# Patient Record
Sex: Female | Born: 1995 | Race: White | Hispanic: No | Marital: Single | Smoking: Current every day smoker
Health system: Southern US, Community
[De-identification: ages and names within clinical notes are randomized; demographics above are authoritative.]

## PROBLEM LIST (undated history)

## (undated) DIAGNOSIS — F1910 Other psychoactive substance abuse, uncomplicated: Secondary | ICD-10-CM

## (undated) DIAGNOSIS — R011 Cardiac murmur, unspecified: Secondary | ICD-10-CM

## (undated) HISTORY — PX: ADENOIDECTOMY: SUR15

## (undated) HISTORY — PX: OTHER SURGICAL HISTORY: SHX169

---
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
## 2017-04-15 ENCOUNTER — Emergency Department
Admission: EM | Admit: 2017-04-15 | Discharge: 2017-04-15 | Discharge status: Against Medical Advice | Payer: Medicaid Other | Attending: Emergency Medicine | Admitting: Emergency Medicine

## 2017-04-15 DIAGNOSIS — Y9289 Other specified places as the place of occurrence of the external cause: Secondary | ICD-10-CM | POA: Insufficient documentation

## 2017-04-15 DIAGNOSIS — M542 Cervicalgia: Secondary | ICD-10-CM | POA: Diagnosis not present

## 2017-04-15 DIAGNOSIS — Z79899 Other long term (current) drug therapy: Secondary | ICD-10-CM | POA: Diagnosis not present

## 2017-04-15 DIAGNOSIS — F17210 Nicotine dependence, cigarettes, uncomplicated: Secondary | ICD-10-CM | POA: Diagnosis not present

## 2017-04-15 DIAGNOSIS — R55 Syncope and collapse: Secondary | ICD-10-CM | POA: Insufficient documentation

## 2017-04-15 DIAGNOSIS — Y998 Other external cause status: Secondary | ICD-10-CM | POA: Insufficient documentation

## 2017-04-15 DIAGNOSIS — Y9389 Activity, other specified: Secondary | ICD-10-CM | POA: Diagnosis not present

## 2017-04-15 DIAGNOSIS — T07XXXA Unspecified multiple injuries, initial encounter: Secondary | ICD-10-CM | POA: Diagnosis not present

## 2017-04-15 DIAGNOSIS — S0083XA Contusion of other part of head, initial encounter: Secondary | ICD-10-CM | POA: Diagnosis not present

## 2017-04-15 DIAGNOSIS — Z5320 Procedure and treatment not carried out because of patient's decision for unspecified reasons: Secondary | ICD-10-CM | POA: Insufficient documentation

## 2017-04-15 DIAGNOSIS — S0993XA Unspecified injury of face, initial encounter: Secondary | ICD-10-CM | POA: Diagnosis present

## 2017-04-15 DIAGNOSIS — M549 Dorsalgia, unspecified: Secondary | ICD-10-CM | POA: Insufficient documentation

## 2017-04-15 DIAGNOSIS — H05222 Edema of left orbit: Secondary | ICD-10-CM | POA: Insufficient documentation
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
## 2017-04-15 NOTE — Discharge Instructions (Signed)
You have decided to discharge yourself from the ED prior to the completion of your exam and assessments. Please know that this is contrary to medical advice. Please return to the ED as soon as possible for further evaluation and treatment.

## 2017-04-15 NOTE — ED Notes (Signed)
Pt states, "I just want to go home, I couldn't see out of my eye but now I can."  Pt agrees to sign out AMA.

## 2017-12-19 ENCOUNTER — Other Ambulatory Visit: Payer: Self-pay

## 2017-12-19 ENCOUNTER — Ambulatory Visit
Admission: EM | Admit: 2017-12-19 | Discharge: 2017-12-19 | Disposition: A | Discharge status: Home / Self Care | Payer: Medicaid Other | Attending: Family Medicine | Admitting: Family Medicine

## 2017-12-19 ENCOUNTER — Encounter: Payer: Self-pay | Admitting: Emergency Medicine

## 2017-12-19 DIAGNOSIS — R059 Cough, unspecified: Secondary | ICD-10-CM

## 2017-12-19 DIAGNOSIS — R05 Cough: Secondary | ICD-10-CM

## 2017-12-19 DIAGNOSIS — J0100 Acute maxillary sinusitis, unspecified: Secondary | ICD-10-CM

## 2017-12-19 MED ORDER — PREDNISONE 20 MG PO TABS: 40.0000 mg | ORAL_TABLET | Freq: Every day | ORAL | 0 refills | Status: AC

## 2017-12-19 MED ORDER — BENZONATATE 100 MG PO CAPS: 100.0000 mg | ORAL_CAPSULE | Freq: Three times a day (TID) | ORAL | 0 refills | Status: AC | PRN

## 2017-12-19 MED ORDER — ALBUTEROL SULFATE HFA 108 (90 BASE) MCG/ACT IN AERS: 2.0000 | INHALATION_SPRAY | RESPIRATORY_TRACT | 0 refills | Status: AC | PRN

## 2017-12-19 MED ORDER — AMOXICILLIN-POT CLAVULANATE 875-125 MG PO TABS: 1.0000 | ORAL_TABLET | Freq: Two times a day (BID) | ORAL | 0 refills | Status: AC

## 2017-12-19 NOTE — ED Provider Notes (Signed)
[facility] ____________________________________________  Time seen: Approximately 5:10 PM  I have reviewed the triage vital signs and the nursing notes.   HISTORY  Chief Complaint Cough   HPI Jocelyn Kane is a 22 y.o. female presented for evaluation of 10 days of cough, runny nose and nasal congestion.  Some chills, denies known fevers.  Has been taking over-the-counter cough and decongestant medication without any change.  States that she intermittently feels tightness in her chest and possible wheezing.  Denies chest pain or continued shortness of breath.  States that she cannot breathe through her nose and has to breathe through her mouth.  States having a lot of very thick greenish nasal drainage.  Denies known direct sick contacts.  Continues to overall eat and drink well.  Denies other aggravating or relieving factors. Denies chest pain, shortness of breath, abdominal pain, dysuria, extremity pain, extremity swelling or rash. Denies recent sickness. Denies recent antibiotic use.  Reports healthy person.  Denies chronic medical problems.  Patient's last menstrual period was 11/28/2017.Denies pregnancy.   Past Medical History:  Diagnosis Date  . Heart murmur     There are no active problems to display for this patient.   Past Surgical History:  Procedure Laterality Date  . adenoid remvoal    . ADENOIDECTOMY       No current facility-administered medications for this encounter.   Current Outpatient Medications:  .  albuterol (PROVENTIL HFA;VENTOLIN HFA) 108 (90 Base) MCG/ACT inhaler, Inhale 2 puffs into the lungs every 4 (four) hours as needed for wheezing., Disp: 1 Inhaler, Rfl: 0 .  amoxicillin-clavulanate (AUGMENTIN) 875-125 MG tablet, Take 1 tablet by mouth every 12 (twelve) hours., Disp: 20 tablet, Rfl: 0 .  benzonatate (TESSALON PERLES) 100 MG capsule, Take 1 capsule (100 mg total) by mouth 3 (three) times daily as needed., Disp: 15 capsule, Rfl: 0 .   pramoxine-hydrocortisone (EPIFOAM) 1-1 % foam, Apply topically 3 (three) times daily., Disp: 10 g, Rfl: 0 .  predniSONE (DELTASONE) 20 MG tablet, Take 2 tablets (40 mg total) by mouth daily., Disp: 10 tablet, Rfl: 0  Allergies Red dye  History reviewed. No pertinent family history.  Social History Social History   Tobacco Use  . Smoking status: Current Some Day Smoker    Packs/day: 1.00    Types: Cigarettes  . Smokeless tobacco: Never Used  Substance Use Topics  . Alcohol use: Yes    Alcohol/week: 0.6 oz    Types: 1 Glasses of wine per week  . Drug use: Not Currently    Types: Marijuana, Cocaine    Comment: Xanax, cocaine in past    Review of Systems Constitutional: No fever/chills ENT: No sore throat. Cardiovascular: Denies chest pain. Respiratory: AS above.  Gastrointestinal: No abdominal pain.  No nausea, no vomiting.  No diarrhea.  No constipation. Genitourinary: Negative for dysuria. Musculoskeletal: Negative for back pain. Skin: Negative for rash.   ____________________________________________   PHYSICAL EXAM:  VITAL SIGNS: ED Triage Vitals  Enc Vitals Group     BP 12/19/17 1325 113/81     Pulse Rate 12/19/17 1325 75     Resp 12/19/17 1325 16     Temp 12/19/17 1325 98.6 F (37 C)     Temp Source 12/19/17 1325 Oral     SpO2 12/19/17 1325 98 %     Weight 12/19/17 1322 135 lb (61.2 kg)     Height 12/19/17 1322 5' (1.524 m)     Head Circumference --  Peak Flow --      Pain Score 12/19/17 1322 6     Pain Loc --      Pain Edu? --      Excl. in GC? --     Constitutional: Alert and oriented. Well appearing and in no acute distress. Eyes: Conjunctivae are normal.  Head: Atraumatic.Mild to moderate tenderness to palpation bilateral nontender frontal sinuses.  Maxillary sinuses. No swelling. No erythema.   Ears: no erythema, normal TMs bilaterally.   Nose: nasal congestion with bilateral nasal turbinate erythema and edema.   Mouth/Throat: Mucous  membranes are moist.  Oropharynx non-erythematous.No tonsillar swelling or exudate.  Neck: No stridor.  No cervical spine tenderness to palpation. Hematological/Lymphatic/Immunilogical: No cervical lymphadenopathy. Cardiovascular: Normal rate, regular rhythm. Grossly normal heart sounds.  Good peripheral circulation. Respiratory: Normal respiratory effort.  No retractions. No wheezes, rales or rhonchi. Good air movement.  Dry intermittent cough noted in room with mild bronchospasm.  Speaks in complete sentences. Musculoskeletal: No lower or upper extremity tenderness nor edema. No cervical, thoracic or lumbar tenderness to palpation.  Neurologic:  Normal speech and language. No gross focal neurologic deficits are appreciated. No gait instability. Skin:  Skin is warm, dry and intact. No rash noted. Psychiatric: Mood and affect are normal. Speech and behavior are normal.  ___________________________________________   LABS (all labs ordered are listed, but only abnormal results are displayed)  Labs Reviewed - No data to display   PROCEDURES Procedures    INITIAL IMPRESSION / ASSESSMENT AND PLAN / ED COURSE  Pertinent labs & imaging results that were available during my care of the patient were reviewed by me and considered in my medical decision making (see chart for details).  Well-appearing patient.  No acute distress.  Suspect sinusitis.  Patient does have intermittent bronchospasm noted with cough.  Will treat patient with oral Augmentin, prednisone, PRN albuterol inhaler and PRN Tessalon Perles.  Encourage rest, fluids, supportive care.Discussed indication, risks and benefits of medications with patient.  Work note given.  Discussed follow up with Primary care physician this week. Discussed follow up and return parameters including no resolution or any worsening concerns. Patient verbalized understanding and agreed to plan.   ____________________________________________   FINAL  CLINICAL IMPRESSION(S) / ED DIAGNOSES  Final diagnoses:  Acute maxillary sinusitis, recurrence not specified  Cough     ED Discharge Orders        Ordered    predniSONE (DELTASONE) 20 MG tablet  Daily     12/19/17 1418    albuterol (PROVENTIL HFA;VENTOLIN HFA) 108 (90 Base) MCG/ACT inhaler  Every 4 hours PRN     12/19/17 1418    benzonatate (TESSALON PERLES) 100 MG capsule  3 times daily PRN     12/19/17 1418    amoxicillin-clavulanate (AUGMENTIN) 875-125 MG tablet  Every 12 hours     12/19/17 1418       Note: This dictation was prepared with Dragon dictation along with smaller phrase technology. Any transcriptional errors that result from this process are unintentional.         Renford DillsMiller, Kadiatou Oplinger, NP 12/22/17 1408

## 2017-12-19 NOTE — ED Triage Notes (Signed)
Patient c/o cough and chest congestion for 10 days.  

## 2017-12-19 NOTE — Discharge Instructions (Addendum)
Take medication as prescribed. Rest. Drink plenty of fluids.  ° °Follow up with your primary care physician this week as needed. Return to Urgent care for new or worsening concerns.  ° °

## 2018-05-09 ENCOUNTER — Ambulatory Visit: Payer: Self-pay

## 2018-05-09 ENCOUNTER — Encounter: Payer: Self-pay | Admitting: Family Medicine

## 2018-05-09 ENCOUNTER — Ambulatory Visit (INDEPENDENT_AMBULATORY_CARE_PROVIDER_SITE_OTHER): Payer: Self-pay | Admitting: General Practice

## 2018-05-09 ENCOUNTER — Other Ambulatory Visit: Payer: Self-pay | Admitting: *Deleted

## 2018-05-09 DIAGNOSIS — Z3491 Encounter for supervision of normal pregnancy, unspecified, first trimester: Secondary | ICD-10-CM

## 2018-05-09 DIAGNOSIS — Z3201 Encounter for pregnancy test, result positive: Secondary | ICD-10-CM

## 2018-05-09 DIAGNOSIS — Z3687 Encounter for antenatal screening for uncertain dates: Secondary | ICD-10-CM

## 2018-05-09 LAB — POCT PREGNANCY, URINE: PREG TEST UR: POSITIVE — AB

## 2018-05-09 MED ORDER — PREPLUS 27-1 MG PO TABS: 1.0000 | ORAL_TABLET | Freq: Every day | ORAL | 6 refills | Status: AC

## 2018-05-09 MED ORDER — PRENATAL VITAMINS 0.8 MG PO TABS: 1.0000 | ORAL_TABLET | Freq: Every day | ORAL | 12 refills | Status: DC

## 2018-05-09 MED ORDER — PROMETHAZINE HCL 25 MG PO TABS
25.0000 mg | ORAL_TABLET | Freq: Four times a day (QID) | ORAL | 0 refills | Status: AC | PRN
Start: 2018-05-09 — End: ?

## 2018-05-09 NOTE — Progress Notes (Signed)
Patient presents to office today for UPT. UPT +. Patient reports first positive home test yesterday. LMP sometime end of August but patient isn't sure when. Patient denies taking any meds/vitamins. Patient would like Rx for PNV and something for nausea as well. Prescriptions sent in & patient informed. Will do ultrasound for dating today.   Pt informed that the ultrasound is considered a limited OB ultrasound and is not intended to be a complete ultrasound exam.  Patient also informed that the ultrasound is not being completed with the intent of assessing for fetal or placental anomalies or any pelvic abnormalities.  Explained that the purpose of today's ultrasound is to assess for  dating and viability.  Patient acknowledges the purpose of the exam and the limitations of the study.    Ultrasound reveals [redacted]w[redacted]d gestational age by CRL. FHR 135 EDD 12/24/18. Patient informed & advised she begin OB care.  Marylynn Pearson RN BSN

## 2018-05-09 NOTE — Progress Notes (Signed)
Chart reviewed - agree with RN documentation.   

## 2018-05-09 NOTE — Progress Notes (Signed)
[facility] in [place] called about the pt's prenatal prescription.  Wanted to know if provider meant to order 0.8mg  folic acid or if the wanted 1mg  folic acid with is prescription strength.  Reviewed with Luna Kitchens CNM and she gave verbal order to change the prescription to contain 1mg  folic acid.  Order placed and sent to pt's listed pharmacy.

## 2019-04-25 ENCOUNTER — Ambulatory Visit: Payer: Medicaid Other

## 2019-07-05 ENCOUNTER — Encounter: Payer: Self-pay | Admitting: Family Medicine

## 2019-07-05 ENCOUNTER — Ambulatory Visit (LOCAL_COMMUNITY_HEALTH_CENTER): Payer: Medicaid Other | Admitting: Family Medicine

## 2019-07-05 ENCOUNTER — Other Ambulatory Visit: Payer: Self-pay

## 2019-07-05 VITALS — BP 136/78 | Ht 64.0 in | Wt 165.0 lb

## 2019-07-05 DIAGNOSIS — N939 Abnormal uterine and vaginal bleeding, unspecified: Secondary | ICD-10-CM | POA: Diagnosis not present

## 2019-07-05 DIAGNOSIS — Z113 Encounter for screening for infections with a predominantly sexual mode of transmission: Secondary | ICD-10-CM

## 2019-07-05 DIAGNOSIS — Z30013 Encounter for initial prescription of injectable contraceptive: Secondary | ICD-10-CM

## 2019-07-05 MED ORDER — MEDROXYPROGESTERONE ACETATE 150 MG/ML IM SUSP
150.0000 mg | INTRAMUSCULAR | Status: AC
  Administered 2019-07-05: 150 mg via INTRAMUSCULAR

## 2019-07-05 NOTE — Progress Notes (Signed)
Pt here for birth control and is interested in the Depo. Pt reports had Depo ~5 months ago at [facility]. Pt reports she has been on her period for about 3 weeks and that it is really heavy still.Ronny Bacon, RN

## 2019-07-05 NOTE — Progress Notes (Signed)
Family Planning Visit  Subjective:  Jocelyn Kane is a 23 y.o. being seen today for  Chief Complaint  Patient presents with  . Contraception    here for birth control    does not have a problem list on file.  HPI  Patient reports she would like to restart depo. Last injection 5 months ago, got it at her 6 wk pp visit at [facility] She is 2 months overdue. Last sex approx 3 months ago, while using depo.   Reports she has had vaginal bleeding x 2wks, now resolving, believes may be d/t missing depo shot. First period since initial month on Depo. Denies abd pain, cramping, dizziness, fainting.     Patient's last menstrual period was 06/17/2019 (within days).   Last pap: July 2020 at [facility].   Patient reports 1 partner(s) in last year. Do they desire STI screening (if no, why not)? Yes. Denies symptoms.   Does the patient desire a pregnancy in the next year? no   23 y.o., Body mass index is 28.32 kg/m. - Is patient eligible for HA1C diabetes screening based on BMI and age >73?  no  Does the patient have a current or past history of drug use? no No components found for: HCV  See flowsheet for other program required questions.   Health Maintenance Due  Topic Date Due  . CHLAMYDIA SCREENING  04/08/2013  . HIV Screening  04/08/2013  . INFLUENZA VACCINE  02/17/2019  . PAP-Cervical Cytology Screening  04/09/2019  . PAP SMEAR-Modifier  04/09/2019    ROS  The following portions of the patient's history were reviewed and updated as appropriate: allergies, current medications, past family history, past medical history, past social history, past surgical history and problem list. Problem list updated.  Objective:  BP 136/78   Ht 5\' 4"  (1.626 m)   Wt 165 lb (74.8 kg)   LMP 06/17/2019 (Within Days) Comment: heavy and still currently bleeding  BMI 28.32 kg/m    Physical Exam Vitals and nursing note reviewed.  Constitutional:      Appearance: Normal appearance.   HENT:     Head: Normocephalic and atraumatic.     Mouth/Throat:     Mouth: Mucous membranes are moist.     Pharynx: Oropharynx is clear. No oropharyngeal exudate or posterior oropharyngeal erythema.  Pulmonary:     Effort: Pulmonary effort is normal.  Abdominal:     General: Abdomen is flat.     Palpations: There is no mass.     Tenderness: There is no abdominal tenderness. There is no rebound.  Genitourinary:    General: Normal vulva.     Exam position: Lithotomy position.     Pubic Area: No rash or pubic lice.      Labia:        Right: No rash or lesion.        Left: No rash or lesion.      Vagina: Normal. No vaginal discharge, erythema, bleeding or lesions.     Cervix: Cervical bleeding (1 drop blood from os) present. No cervical motion tenderness, discharge, friability, lesion or erythema.     Uterus: Normal.      Adnexa: Right adnexa normal and left adnexa normal.     Rectum: Normal.  Lymphadenopathy:     Head:     Right side of head: No preauricular or posterior auricular adenopathy.     Left side of head: No preauricular or posterior auricular adenopathy.  Cervical: No cervical adenopathy.     Upper Body:     Right upper body: No supraclavicular or axillary adenopathy.     Left upper body: No supraclavicular or axillary adenopathy.     Lower Body: No right inguinal adenopathy. No left inguinal adenopathy.  Skin:    General: Skin is warm and dry.     Findings: No rash.  Neurological:     Mental Status: She is alert and oriented to person, place, and time.      Assessment and Plan:  Jocelyn Kane is a 23 y.o. female presenting to the [facility] Department for a well woman exam/family planning visit  Contraception counseling: Reviewed all forms of birth control options in the tiered based approach. available including abstinence; over the counter/barrier methods; hormonal contraceptive medication including pill, patch, ring, injection,contraceptive  implant; hormonal and nonhormonal IUDs; permanent sterilization options including vasectomy and the various tubal sterilization modalities. Risks, benefits, how to discontinue and typical effectiveness rates were reviewed.  Questions were answered.  Written information was also given to the patient to review.  Patient desires depo, this was prescribed for patient. She will follow up in  3 months for surveillance.  She was told to call with any further questions, or with any concerns about this method of contraception.  Emphasized use of condoms 100% of the time for STI prevention.  Emergency Contraception: n/a    1. Encounter for initial prescription of injectable contraceptive Depo rx today. Advised to RTC for yearly physical along with next depo as we are not currently doing them during Covid.  -Counseling as above. - medroxyPROGESTERone (DEPO-PROVERA) injection 150 mg - Pregnancy, urine  2. Vaginal bleeding -x2 wks, in the setting of stopping Depo, now resolving per pt. We discussed abnormal bleeding is also possible with today's restart of Depo but to RTC for further investigation if it does not stop, is excessive, or pt feels lightheaded/dizzy. She is in agreement.  -Urine preg negative, STI screenings as below. Advised fingerstick hgb, pt declines (dislikes sticks).   3. Screening examination for venereal disease -Screenings today as below. Treat wet prep per standing order -Patient does meet criteria for HepB, HepC Screening. Declines these screenings. Also declines HIV and syphilis screenings.  -Counseled on warning s/sx and when to seek care. Recommended condom use with all sex and discussed importance of condom use for STI prevention.  - WET PREP FOR TRICH, YEAST, CLUE - Chlamydia/Gonorrhea [facility]    Return in about 3 months (around 10/03/2019) for depo + yearly physical.  No future appointments.  Ann Held, PA-C

## 2019-07-06 LAB — PREGNANCY, URINE: Preg Test, Ur: NEGATIVE

## 2019-07-06 LAB — WET PREP FOR TRICH, YEAST, CLUE
Trichomonas Exam: NEGATIVE
Yeast Exam: NEGATIVE

## 2019-07-06 NOTE — Progress Notes (Signed)
Urine PT is negative today and wet mount reviewed and is negative today, so no treatment needed for wet mount per standing order. Pt received Depo 150mg  IM per provider order and pt tolerated well. Counseled pt per provider orders and pt states understanding. Provider orders completed.Ronny Bacon, RN

## 2019-07-08 DIAGNOSIS — Z87891 Personal history of nicotine dependence: Secondary | ICD-10-CM | POA: Diagnosis not present

## 2019-07-08 DIAGNOSIS — M546 Pain in thoracic spine: Secondary | ICD-10-CM | POA: Diagnosis not present

## 2019-07-08 DIAGNOSIS — M545 Low back pain: Secondary | ICD-10-CM | POA: Diagnosis not present

## 2019-10-03 ENCOUNTER — Other Ambulatory Visit: Payer: Self-pay

## 2019-10-03 ENCOUNTER — Ambulatory Visit (LOCAL_COMMUNITY_HEALTH_CENTER): Payer: Medicaid Other

## 2019-10-03 VITALS — BP 100/70 | Ht 66.0 in | Wt 166.0 lb

## 2019-10-03 DIAGNOSIS — Z30013 Encounter for initial prescription of injectable contraceptive: Secondary | ICD-10-CM | POA: Diagnosis not present

## 2019-10-03 DIAGNOSIS — Z3009 Encounter for other general counseling and advice on contraception: Secondary | ICD-10-CM

## 2019-10-03 MED ORDER — MULTI-VITAMIN/MINERALS PO TABS: 1.0000 | ORAL_TABLET | Freq: Every day | ORAL | 0 refills | Status: AC

## 2019-10-03 NOTE — Progress Notes (Signed)
Folic acid counseling completed and MVI dispensed. Depo administered without difficulyt per 07/05/2019 written order of Samara Snide PA-C and client tolerated without difficulty. C/O small burning immediately following injection at administration site. Jossie Ng, RN

## 2019-11-23 DIAGNOSIS — Z87891 Personal history of nicotine dependence: Secondary | ICD-10-CM | POA: Diagnosis not present

## 2019-11-23 DIAGNOSIS — H5711 Ocular pain, right eye: Secondary | ICD-10-CM | POA: Diagnosis not present

## 2019-11-23 DIAGNOSIS — S0501XA Injury of conjunctiva and corneal abrasion without foreign body, right eye, initial encounter: Secondary | ICD-10-CM | POA: Diagnosis not present

## 2019-11-23 DIAGNOSIS — H538 Other visual disturbances: Secondary | ICD-10-CM | POA: Diagnosis not present

## 2019-12-12 ENCOUNTER — Ambulatory Visit: Payer: Medicaid Other

## 2020-01-02 ENCOUNTER — Ambulatory Visit (LOCAL_COMMUNITY_HEALTH_CENTER): Payer: Medicaid Other

## 2020-01-02 ENCOUNTER — Other Ambulatory Visit: Payer: Self-pay

## 2020-01-02 ENCOUNTER — Other Ambulatory Visit: Payer: Self-pay | Admitting: Physician Assistant

## 2020-01-02 VITALS — BP 101/70 | Ht 64.0 in | Wt 160.0 lb

## 2020-01-02 DIAGNOSIS — Z3042 Encounter for surveillance of injectable contraceptive: Secondary | ICD-10-CM

## 2020-01-02 DIAGNOSIS — Z30013 Encounter for initial prescription of injectable contraceptive: Secondary | ICD-10-CM

## 2020-01-02 DIAGNOSIS — Z3009 Encounter for other general counseling and advice on contraception: Secondary | ICD-10-CM | POA: Diagnosis not present

## 2020-01-02 MED ORDER — MEDROXYPROGESTERONE ACETATE 150 MG/ML IM SUSP
150.0000 mg | INTRAMUSCULAR | Status: AC
  Administered 2019-10-03 – 2020-01-02 (×2): 150 mg via INTRAMUSCULAR

## 2020-01-02 NOTE — Progress Notes (Signed)
Per chart review, RP due 06/2020, with CBE and pap.  Provided that patient desires to continue with Depo and BP is normal, OK to continue with Depo until RP is due.

## 2020-01-02 NOTE — Progress Notes (Signed)
Pt is 13.0 weeks post depo today. DMPA 150 mg IM given per Sadie Haber, PA order dated 01/02/20.

## 2020-03-07 ENCOUNTER — Ambulatory Visit: Payer: Medicaid Other

## 2020-06-06 ENCOUNTER — Emergency Department
Admission: EM | Admit: 2020-06-06 | Discharge: 2020-06-06 | Disposition: A | Discharge status: Home / Self Care | Payer: Medicaid Other | Attending: Emergency Medicine | Admitting: Emergency Medicine

## 2020-06-06 ENCOUNTER — Other Ambulatory Visit: Payer: Self-pay

## 2020-06-06 DIAGNOSIS — F17290 Nicotine dependence, other tobacco product, uncomplicated: Secondary | ICD-10-CM | POA: Diagnosis not present

## 2020-06-06 DIAGNOSIS — M549 Dorsalgia, unspecified: Secondary | ICD-10-CM | POA: Diagnosis not present

## 2020-06-06 DIAGNOSIS — R109 Unspecified abdominal pain: Secondary | ICD-10-CM | POA: Diagnosis present

## 2020-06-06 DIAGNOSIS — I10 Essential (primary) hypertension: Secondary | ICD-10-CM | POA: Diagnosis not present

## 2020-06-06 DIAGNOSIS — R5381 Other malaise: Secondary | ICD-10-CM | POA: Diagnosis not present

## 2020-06-06 DIAGNOSIS — N739 Female pelvic inflammatory disease, unspecified: Secondary | ICD-10-CM | POA: Diagnosis not present

## 2020-06-06 DIAGNOSIS — R1030 Lower abdominal pain, unspecified: Secondary | ICD-10-CM | POA: Diagnosis not present

## 2020-06-06 DIAGNOSIS — N730 Acute parametritis and pelvic cellulitis: Secondary | ICD-10-CM

## 2020-06-06 DIAGNOSIS — R1084 Generalized abdominal pain: Secondary | ICD-10-CM | POA: Diagnosis not present

## 2020-06-06 DIAGNOSIS — R52 Pain, unspecified: Secondary | ICD-10-CM | POA: Diagnosis not present

## 2020-06-06 LAB — URINALYSIS, COMPLETE (UACMP) WITH MICROSCOPIC
Bilirubin Urine: NEGATIVE
Glucose, UA: NEGATIVE mg/dL
Hgb urine dipstick: NEGATIVE
Ketones, ur: NEGATIVE mg/dL
Nitrite: NEGATIVE
Protein, ur: NEGATIVE mg/dL
Specific Gravity, Urine: 1.028 (ref 1.005–1.030)
WBC, UA: >50 WBC/hpf — ABNORMAL HIGH (ref 0–5)
pH: 6 (ref 5.0–8.0)

## 2020-06-06 LAB — CBC
HCT: 39.1 % (ref 36.0–46.0)
Hemoglobin: 13.7 g/dL (ref 12.0–15.0)
MCH: 30.4 pg (ref 26.0–34.0)
MCHC: 35 g/dL (ref 30.0–36.0)
MCV: 86.7 fL (ref 80.0–100.0)
Platelets: 248 10*3/uL (ref 150–400)
RBC: 4.51 MIL/uL (ref 3.87–5.11)
RDW: 11.3 % — ABNORMAL LOW (ref 11.5–15.5)
WBC: 10.8 10*3/uL — ABNORMAL HIGH (ref 4.0–10.5)
nRBC: 0 % (ref 0.0–0.2)

## 2020-06-06 LAB — WET PREP, GENITAL
Sperm: NONE SEEN
Trich, Wet Prep: NONE SEEN
Yeast Wet Prep HPF POC: NONE SEEN

## 2020-06-06 LAB — COMPREHENSIVE METABOLIC PANEL
ALT: 14 U/L (ref 0–44)
AST: 14 U/L — ABNORMAL LOW (ref 15–41)
Albumin: 4 g/dL (ref 3.5–5.0)
Alkaline Phosphatase: 86 U/L (ref 38–126)
Anion gap: 10 (ref 5–15)
BUN: 10 mg/dL (ref 6–20)
CO2: 25 mmol/L (ref 22–32)
Calcium: 8.8 mg/dL — ABNORMAL LOW (ref 8.9–10.3)
Chloride: 101 mmol/L (ref 98–111)
Creatinine, Ser: 0.62 mg/dL (ref 0.44–1.00)
GFR, Estimated: >60 mL/min (ref >60)
Glucose, Bld: 94 mg/dL (ref 70–99)
Potassium: 4.2 mmol/L (ref 3.5–5.1)
Sodium: 136 mmol/L (ref 135–145)
Total Bilirubin: 0.7 mg/dL (ref 0.3–1.2)
Total Protein: 7.2 g/dL (ref 6.5–8.1)

## 2020-06-06 LAB — CHLAMYDIA/NGC RT PCR (ARMC ONLY)
Chlamydia Tr: NOT DETECTED
N gonorrhoeae: DETECTED — AB

## 2020-06-06 LAB — POC URINE PREG, ED: Preg Test, Ur: NEGATIVE

## 2020-06-06 LAB — LIPASE, BLOOD: Lipase: 27 U/L (ref 11–51)

## 2020-06-06 MED ORDER — OXYCODONE-ACETAMINOPHEN 5-325 MG PO TABS
1.0000 | ORAL_TABLET | ORAL | 0 refills | Status: AC | PRN
Start: 2020-06-06 — End: ?

## 2020-06-06 MED ORDER — MORPHINE SULFATE (PF) 4 MG/ML IV SOLN
4.0000 mg | Freq: Once | INTRAVENOUS | Status: AC
  Administered 2020-06-06: 4 mg via INTRAVENOUS
  Filled 2020-06-06: qty 1

## 2020-06-06 MED ORDER — DOXYCYCLINE HYCLATE 100 MG PO TABS
100.0000 mg | ORAL_TABLET | Freq: Once | ORAL | Status: AC
  Administered 2020-06-06: 100 mg via ORAL
  Filled 2020-06-06: qty 1

## 2020-06-06 MED ORDER — DOXYCYCLINE HYCLATE 100 MG PO TABS: 100.0000 mg | ORAL_TABLET | Freq: Two times a day (BID) | ORAL | 0 refills | Status: AC

## 2020-06-06 MED ORDER — PROMETHAZINE HCL 25 MG/ML IJ SOLN
12.5000 mg | Freq: Once | INTRAMUSCULAR | Status: AC
  Administered 2020-06-06: 12.5 mg via INTRAMUSCULAR
  Filled 2020-06-06: qty 1

## 2020-06-06 MED ORDER — CEFTRIAXONE SODIUM 250 MG IJ SOLR
250.0000 mg | Freq: Once | INTRAMUSCULAR | Status: AC
  Administered 2020-06-06: 250 mg via INTRAMUSCULAR
  Filled 2020-06-06: qty 250

## 2020-06-06 MED ORDER — AZITHROMYCIN 500 MG PO TABS
1000.0000 mg | ORAL_TABLET | Freq: Once | ORAL | Status: AC
  Administered 2020-06-06: 1000 mg via ORAL
  Filled 2020-06-06: qty 2

## 2020-06-06 MED ORDER — KETOROLAC TROMETHAMINE 30 MG/ML IJ SOLN
30.0000 mg | Freq: Once | INTRAMUSCULAR | Status: AC
  Administered 2020-06-06: 30 mg via INTRAMUSCULAR
  Filled 2020-06-06: qty 1

## 2020-06-06 NOTE — Discharge Instructions (Signed)
As we discussed please take your antibiotics for their entire course.  Please take your pain medication as needed, as prescribed.  Return to the emergency department for any worsening of your abdominal pain or failure of your abdominal pain to improve.  Return to the emergency department immediately if you develop a fever, vomiting or unable to keep down your antibiotics.

## 2020-06-06 NOTE — ED Provider Notes (Signed)
[facility] Emergency Department Provider Note  Time seen: 2:45 PM  I have reviewed the triage vital signs and the nursing notes.   HISTORY  Chief Complaint Abdominal Pain   HPI Jocelyn Kane is a 24 y.o. female with a past medical history of prior PID, UTIs, presents to the emergency department for lower abdominal pain over the past for 5 days.  Patient describes as moderate to severe abdominal pain mostly across the lower abdomen where her C-section scar is.  C-section was approximately a year and a half ago per patient.  Patient states she has been experiencing dysuria as well as vaginal discharge over the past 1 month.  States she was diagnosed with a UTI but never filled the antibiotics.  Patient also states a history of PID previously.  She is sexually active.   Past Medical History:  Diagnosis Date  . Heart murmur     There are no problems to display for this patient.   Past Surgical History:  Procedure Laterality Date  . adenoid remvoal    . ADENOIDECTOMY      Prior to Admission medications   Medication Sig Start Date End Date Taking? Authorizing Provider  albuterol (PROVENTIL HFA;VENTOLIN HFA) 108 (90 Base) MCG/ACT inhaler Inhale 2 puffs into the lungs every 4 (four) hours as needed for wheezing. Patient not taking: Reported on 07/05/2019 12/19/17   Renford Dills, NP  amoxicillin-clavulanate (AUGMENTIN) 875-125 MG tablet Take 1 tablet by mouth every 12 (twelve) hours. Patient not taking: Reported on 07/05/2019 12/19/17   Renford Dills, NP  benzonatate (TESSALON PERLES) 100 MG capsule Take 1 capsule (100 mg total) by mouth 3 (three) times daily as needed. Patient not taking: Reported on 07/05/2019 12/19/17   Renford Dills, NP  Multiple Vitamins-Minerals (MULTIVITAMIN WITH MINERALS) tablet Take 1 tablet by mouth daily. Patient not taking: Reported on 01/02/2020 10/03/19   Federico Flake, MD  pramoxine-hydrocortisone [facility]) 1-1 % foam Apply  topically 3 (three) times daily. Patient not taking: Reported on 07/05/2019 07/06/15   Governor Rooks, MD  predniSONE (DELTASONE) 20 MG tablet Take 2 tablets (40 mg total) by mouth daily. Patient not taking: Reported on 07/05/2019 12/19/17   Renford Dills, NP  Prenatal Vit-Fe Fumarate-FA (PREPLUS) 27-1 MG TABS Take 1 tablet by mouth daily. Patient not taking: Reported on 07/05/2019 05/09/18   Marylene Land, CNM  promethazine (PHENERGAN) 25 MG tablet Take 1 tablet (25 mg total) by mouth every 6 (six) hours as needed for nausea or vomiting. Patient not taking: Reported on 07/05/2019 05/09/18   Marylene Land, CNM    Allergies  Allergen Reactions  . Red Dye Rash    Red dye #40    History reviewed. No pertinent family history.  Social History Social History   Tobacco Use  . Smoking status: Current Every Day Smoker    Types: E-cigarettes  . Smokeless tobacco: Never Used  Substance Use Topics  . Alcohol use: Yes    Alcohol/week: 6.0 standard drinks    Types: 6 Glasses of wine per week  . Drug use: Not Currently    Types: Marijuana, Cocaine    Comment: Xanax, cocaine in past    Review of Systems Constitutional: Negative for fever. Cardiovascular: Negative for chest pain. Respiratory: Negative for shortness of breath. Gastrointestinal: Positive for abdominal pain.  Positive for nausea. GU: Positive for vaginal discharge and dysuria Musculoskeletal: Negative for musculoskeletal complaints Neurological: Negative for headache All other ROS negative  ____________________________________________   PHYSICAL  EXAM:  VITAL SIGNS: ED Triage Vitals [06/06/20 1147]  Enc Vitals Group     BP 116/71     Pulse Rate 99     Resp 18     Temp 99.1 F (37.3 C)     Temp Source Oral     SpO2 100 %     Weight 140 lb (63.5 kg)     Height 5\' 4"  (1.626 m)     Head Circumference      Peak Flow      Pain Score 8     Pain Loc      Pain Edu?      Excl. in GC?      Constitutional: Alert and oriented. Well appearing and in no distress. Eyes: Normal exam ENT      Head: Normocephalic and atraumatic.      Mouth/Throat: Mucous membranes are moist. Cardiovascular: Normal rate, regular rhythm.  Respiratory: Normal respiratory effort without tachypnea nor retractions. Breath sounds are clear  Gastrointestinal: Soft, moderate diffuse tenderness worse in the lower abdomen.  No rebound guarding or distention. Musculoskeletal: Nontender with normal range of motion in all extremities.  Neurologic:  Normal speech and language. No gross focal neurologic deficits Skin:  Skin is warm, dry and intact.  Psychiatric: Mood and affect are normal  ____________________________________________  INITIAL IMPRESSION / ASSESSMENT AND PLAN / ED COURSE  Pertinent labs & imaging results that were available during my care of the patient were reviewed by me and considered in my medical decision making (see chart for details).   Patient presents emergency department for approximately 5 days of lower abdominal discomfort, 1 month of dysuria as well as vaginal discharge.  History of UTIs as well as PID.  States she was diagnosed with a UTI but never filled her prescription.  Patient is sexually active.  Patient has diffuse tenderness on exam but no area of focal tenderness identified.  No rebound guarding or distention. Reassuringly white blood cell count of 10,800.  Urinalysis does show significant amount of white cells with very bacteria.  This could be consistent with either pelvic infection or urinary tract infection.  I have sent a urine culture.  We will perform a pelvic exam and swab for gonorrhea and chlamydia.  We will dose pain medication.  Pelvic examination shows moderate cervical discharge with significant cervical motion tenderness.  We will cover with Zithromax and Rocephin in the emergency department as well as discharged with doxycycline.  We will also discharged with  a short course of pain medication and 10 days of doxycycline.  Discussed return precautions such as fever worsening pain.  Patient agreeable to plan of care.  [name] was evaluated in Emergency Department on 06/06/2020 for the symptoms described in the history of present illness. She was evaluated in the context of the global COVID-19 pandemic, which necessitated consideration that the patient might be at risk for infection with the SARS-CoV-2 virus that causes COVID-19. Institutional protocols and algorithms that pertain to the evaluation of patients at risk for COVID-19 are in a state of rapid change based on information released by regulatory bodies including the CDC and federal and state organizations. These policies and algorithms were followed during the patient's care in the ED.  ____________________________________________   FINAL CLINICAL IMPRESSION(S) / ED DIAGNOSES  Pelvic inflammatory disease   06/08/2020, MD 06/06/20 1506

## 2020-06-06 NOTE — ED Triage Notes (Signed)
First RN Note: Pt to ED via [facility] from home with c/o RLQ abdominal pain that radiates to back. Per EMS pain 8/10 x 4-5 days. Per EMS pt with hx of "complicated C-section", pain directly over C-section site. Per EMS pt possibly pregnant, also dx with UTI approx 1 month ago and never treated UTI.  154/101 102Hr 98% 18RR

## 2020-06-08 LAB — URINE CULTURE: Culture: <10000 — AB

## 2020-06-09 ENCOUNTER — Telehealth: Payer: Self-pay | Admitting: Emergency Medicine

## 2020-06-16 NOTE — Telephone Encounter (Signed)
Called patient again.  She was home this time.  I gave her std results.  She said she lost her doxycycline over the weekend and has not taken in 3 days.  Per dr Cyril Loosen call in another rx for doxycycline 100 mg twice a day for 10 days.  Called in to [facility].

## 2020-06-25 ENCOUNTER — Ambulatory Visit
Admission: EM | Admit: 2020-06-25 | Discharge: 2020-06-25 | Disposition: A | Discharge status: Home / Self Care | Payer: Medicaid Other

## 2020-06-25 ENCOUNTER — Encounter: Payer: Medicaid Other | Admitting: Certified Nurse Midwife

## 2020-06-25 ENCOUNTER — Other Ambulatory Visit: Payer: Self-pay

## 2020-06-26 DIAGNOSIS — F17290 Nicotine dependence, other tobacco product, uncomplicated: Secondary | ICD-10-CM | POA: Diagnosis not present

## 2020-06-26 DIAGNOSIS — Z881 Allergy status to other antibiotic agents status: Secondary | ICD-10-CM | POA: Diagnosis not present

## 2020-06-26 DIAGNOSIS — Z886 Allergy status to analgesic agent status: Secondary | ICD-10-CM | POA: Diagnosis not present

## 2020-06-26 DIAGNOSIS — L309 Dermatitis, unspecified: Secondary | ICD-10-CM | POA: Diagnosis not present

## 2020-06-26 DIAGNOSIS — Z888 Allergy status to other drugs, medicaments and biological substances status: Secondary | ICD-10-CM | POA: Diagnosis not present

## 2020-06-26 DIAGNOSIS — N6452 Nipple discharge: Secondary | ICD-10-CM | POA: Diagnosis not present

## 2020-06-26 DIAGNOSIS — N611 Abscess of the breast and nipple: Secondary | ICD-10-CM | POA: Diagnosis not present

## 2020-06-26 DIAGNOSIS — R509 Fever, unspecified: Secondary | ICD-10-CM | POA: Diagnosis not present

## 2020-06-26 DIAGNOSIS — L539 Erythematous condition, unspecified: Secondary | ICD-10-CM | POA: Diagnosis not present

## 2020-08-26 DIAGNOSIS — Z3009 Encounter for other general counseling and advice on contraception: Secondary | ICD-10-CM | POA: Diagnosis not present

## 2020-08-26 DIAGNOSIS — Z1388 Encounter for screening for disorder due to exposure to contaminants: Secondary | ICD-10-CM | POA: Diagnosis not present

## 2020-08-26 DIAGNOSIS — Z0389 Encounter for observation for other suspected diseases and conditions ruled out: Secondary | ICD-10-CM | POA: Diagnosis not present

## 2020-11-17 DIAGNOSIS — Z87891 Personal history of nicotine dependence: Secondary | ICD-10-CM | POA: Diagnosis not present

## 2021-10-08 ENCOUNTER — Other Ambulatory Visit: Payer: Self-pay

## 2021-10-08 ENCOUNTER — Emergency Department
Admission: EM | Admit: 2021-10-08 | Discharge: 2021-10-08 | Discharge status: Against Medical Advice | Payer: Medicaid Other | Attending: Emergency Medicine | Admitting: Emergency Medicine

## 2021-10-08 ENCOUNTER — Encounter: Payer: Self-pay | Admitting: Intensive Care

## 2021-10-08 DIAGNOSIS — R1110 Vomiting, unspecified: Secondary | ICD-10-CM | POA: Insufficient documentation

## 2021-10-08 DIAGNOSIS — Z5321 Procedure and treatment not carried out due to patient leaving prior to being seen by health care provider: Secondary | ICD-10-CM | POA: Diagnosis not present

## 2021-10-08 DIAGNOSIS — R52 Pain, unspecified: Secondary | ICD-10-CM | POA: Diagnosis not present

## 2021-10-08 HISTORY — DX: Other psychoactive substance abuse, uncomplicated: F19.10

## 2021-10-08 LAB — CBC WITH DIFFERENTIAL/PLATELET
Abs Immature Granulocytes: 0.08 10*3/uL — ABNORMAL HIGH (ref 0.00–0.07)
Basophils Absolute: 0 10*3/uL (ref 0.0–0.1)
Basophils Relative: 0 %
Eosinophils Absolute: 0 10*3/uL (ref 0.0–0.5)
Eosinophils Relative: 0 %
HCT: 43.5 % (ref 36.0–46.0)
Hemoglobin: 14.3 g/dL (ref 12.0–15.0)
Immature Granulocytes: 0 %
Lymphocytes Relative: 7 %
Lymphs Abs: 1.4 10*3/uL (ref 0.7–4.0)
MCH: 29 pg (ref 26.0–34.0)
MCHC: 32.9 g/dL (ref 30.0–36.0)
MCV: 88.2 fL (ref 80.0–100.0)
Monocytes Absolute: 1.1 10*3/uL — ABNORMAL HIGH (ref 0.1–1.0)
Monocytes Relative: 5 %
Neutro Abs: 19 10*3/uL — ABNORMAL HIGH (ref 1.7–7.7)
Neutrophils Relative %: 88 %
Platelets: 299 10*3/uL (ref 150–400)
RBC: 4.93 MIL/uL (ref 3.87–5.11)
RDW: 11.9 % (ref 11.5–15.5)
WBC: 21.5 10*3/uL — ABNORMAL HIGH (ref 4.0–10.5)
nRBC: 0 % (ref 0.0–0.2)

## 2021-10-08 LAB — COMPREHENSIVE METABOLIC PANEL
ALT: 24 U/L (ref 0–44)
AST: 25 U/L (ref 15–41)
Albumin: 4.6 g/dL (ref 3.5–5.0)
Alkaline Phosphatase: 79 U/L (ref 38–126)
Anion gap: 14 (ref 5–15)
BUN: 15 mg/dL (ref 6–20)
CO2: 24 mmol/L (ref 22–32)
Calcium: 8.8 mg/dL — ABNORMAL LOW (ref 8.9–10.3)
Chloride: 103 mmol/L (ref 98–111)
Creatinine, Ser: 1.27 mg/dL — ABNORMAL HIGH (ref 0.44–1.00)
GFR, Estimated: >60 mL/min (ref >60)
Glucose, Bld: 197 mg/dL — ABNORMAL HIGH (ref 70–99)
Potassium: 3.7 mmol/L (ref 3.5–5.1)
Sodium: 141 mmol/L (ref 135–145)
Total Bilirubin: 0.8 mg/dL (ref 0.3–1.2)
Total Protein: 7.7 g/dL (ref 6.5–8.1)

## 2021-10-08 LAB — URINE DRUG SCREEN, QUALITATIVE (ARMC ONLY)
Amphetamines, Ur Screen: NOT DETECTED
Barbiturates, Ur Screen: NOT DETECTED
Benzodiazepine, Ur Scrn: POSITIVE — AB
Cannabinoid 50 Ng, Ur ~~LOC~~: POSITIVE — AB
Cocaine Metabolite,Ur ~~LOC~~: NOT DETECTED
MDMA (Ecstasy)Ur Screen: NOT DETECTED
Methadone Scn, Ur: NOT DETECTED
Opiate, Ur Screen: NOT DETECTED
Phencyclidine (PCP) Ur S: NOT DETECTED
Tricyclic, Ur Screen: NOT DETECTED

## 2021-10-08 LAB — ETHANOL: Alcohol, Ethyl (B): <10 mg/dL (ref <10)

## 2021-10-08 NOTE — ED Triage Notes (Addendum)
Patient reports smoking a blunt and believes it was laced with fentanyl. 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 Reports she immediately got sick and had episodes of emesis after smoking. C/o generalized pain. Patient A&O x4 during triage. Reports driving herself to ER ? ?
